# Patient Record
Sex: Male | Born: 1940 | Race: White | Hispanic: No | Marital: Married | Smoking: Never smoker
Health system: Southern US, Community
[De-identification: ages and names within clinical notes are randomized; demographics above are authoritative.]

## PROBLEM LIST (undated history)

## (undated) HISTORY — PX: CHOLECYSTECTOMY: SHX55

---

## 2014-12-19 ENCOUNTER — Emergency Department (HOSPITAL_COMMUNITY)
Admission: EM | Admit: 2014-12-19 | Discharge: 2014-12-19 | Disposition: A | Discharge status: Home / Self Care | Payer: Medicare HMO | Attending: Emergency Medicine | Admitting: Emergency Medicine

## 2014-12-19 ENCOUNTER — Encounter (HOSPITAL_COMMUNITY): Payer: Self-pay | Admitting: Emergency Medicine

## 2014-12-19 ENCOUNTER — Emergency Department (HOSPITAL_COMMUNITY): Payer: Medicare HMO

## 2014-12-19 DIAGNOSIS — Z79899 Other long term (current) drug therapy: Secondary | ICD-10-CM | POA: Diagnosis not present

## 2014-12-19 DIAGNOSIS — R112 Nausea with vomiting, unspecified: Secondary | ICD-10-CM | POA: Insufficient documentation

## 2014-12-19 DIAGNOSIS — R42 Dizziness and giddiness: Secondary | ICD-10-CM | POA: Diagnosis present

## 2014-12-19 LAB — CBC WITH DIFFERENTIAL/PLATELET
BASOS ABS: 0 10*3/uL (ref 0.0–0.1)
Basophils Relative: 0 % (ref 0–1)
EOS ABS: 0 10*3/uL (ref 0.0–0.7)
EOS PCT: 1 % (ref 0–5)
HCT: 39.5 % (ref 39.0–52.0)
HEMOGLOBIN: 13.5 g/dL (ref 13.0–17.0)
LYMPHS PCT: 10 % — AB (ref 12–46)
Lymphs Abs: 0.8 10*3/uL (ref 0.7–4.0)
MCH: 29.6 pg (ref 26.0–34.0)
MCHC: 34.2 g/dL (ref 30.0–36.0)
MCV: 86.6 fL (ref 78.0–100.0)
Monocytes Absolute: 0.3 10*3/uL (ref 0.1–1.0)
Monocytes Relative: 4 % (ref 3–12)
NEUTROS PCT: 85 % — AB (ref 43–77)
Neutro Abs: 6.7 10*3/uL (ref 1.7–7.7)
PLATELETS: 143 10*3/uL — AB (ref 150–400)
RBC: 4.56 MIL/uL (ref 4.22–5.81)
RDW: 13.4 % (ref 11.5–15.5)
WBC: 7.8 10*3/uL (ref 4.0–10.5)

## 2014-12-19 LAB — BASIC METABOLIC PANEL
ANION GAP: 7 (ref 5–15)
BUN: 15 mg/dL (ref 6–20)
CO2: 26 mmol/L (ref 22–32)
Calcium: 9 mg/dL (ref 8.9–10.3)
Chloride: 105 mmol/L (ref 101–111)
Creatinine, Ser: 1.19 mg/dL (ref 0.61–1.24)
GFR, EST NON AFRICAN AMERICAN: 58 mL/min — AB (ref >60)
Glucose, Bld: 171 mg/dL — ABNORMAL HIGH (ref 65–99)
POTASSIUM: 3.8 mmol/L (ref 3.5–5.1)
SODIUM: 138 mmol/L (ref 135–145)

## 2014-12-19 LAB — TROPONIN I

## 2014-12-19 LAB — URINALYSIS, ROUTINE W REFLEX MICROSCOPIC
BILIRUBIN URINE: NEGATIVE
Glucose, UA: NEGATIVE mg/dL
Hgb urine dipstick: NEGATIVE
KETONES UR: NEGATIVE mg/dL
Leukocytes, UA: NEGATIVE
NITRITE: NEGATIVE
PH: 7 (ref 5.0–8.0)
PROTEIN: NEGATIVE mg/dL
Specific Gravity, Urine: 1.01 (ref 1.005–1.030)
UROBILINOGEN UA: 0.2 mg/dL (ref 0.0–1.0)

## 2014-12-19 MED ORDER — ONDANSETRON 4 MG PO TBDP: 4.0000 mg | ORAL_TABLET | Freq: Three times a day (TID) | ORAL | Status: AC | PRN

## 2014-12-19 MED ORDER — MECLIZINE HCL 12.5 MG PO TABS
25.0000 mg | ORAL_TABLET | Freq: Once | ORAL | Status: AC
  Administered 2014-12-19: 25 mg via ORAL
  Filled 2014-12-19: qty 2

## 2014-12-19 MED ORDER — OMEPRAZOLE 20 MG PO CPDR: 20.0000 mg | DELAYED_RELEASE_CAPSULE | Freq: Two times a day (BID) | ORAL | Status: DC

## 2014-12-19 MED ORDER — DICYCLOMINE HCL 20 MG PO TABS: 20.0000 mg | ORAL_TABLET | Freq: Two times a day (BID) | ORAL | Status: DC

## 2014-12-19 MED ORDER — MECLIZINE HCL 25 MG PO TABS: 25.0000 mg | ORAL_TABLET | Freq: Three times a day (TID) | ORAL | Status: AC | PRN

## 2014-12-19 MED ORDER — ONDANSETRON 4 MG PO TBDP: 4.0000 mg | ORAL_TABLET | Freq: Three times a day (TID) | ORAL | Status: DC | PRN

## 2014-12-19 MED ORDER — SODIUM CHLORIDE 0.9 % IV BOLUS (SEPSIS)
1000.0000 mL | Freq: Once | INTRAVENOUS | Status: AC
  Administered 2014-12-19: 1000 mL via INTRAVENOUS

## 2014-12-19 MED ORDER — ONDANSETRON HCL 4 MG/2ML IJ SOLN
4.0000 mg | Freq: Once | INTRAMUSCULAR | Status: AC
  Administered 2014-12-19: 4 mg via INTRAVENOUS
  Filled 2014-12-19: qty 2

## 2014-12-19 NOTE — ED Notes (Signed)
Pt states for the last week intermittent spells of feeling light headed, today associated with vomiting

## 2014-12-19 NOTE — ED Provider Notes (Signed)
CSN: [id number]     Arrival date & time 12/19/14  1159 History  This chart was scribed for Rolland Porter, MD by Octavia Heir, ED Scribe. This patient was seen in room APA01/APA01 and the patient's care was started at 12:38 PM.    Chief Complaint  Patient presents with  . Dizziness      The history is provided by the patient. No language interpreter was used.   HPI Comments: Billy Reyes is a 74 y.o. male who presents to the Emergency Department complaining of sudden onset, intermittent light-headedness onset this morning. He notes this morning he got out of bed and began to feel light-headed. He walked to the table and notes he began to feel as if he was going to pass out and he also vomited. Pt He has associated nausea and felt as if he had to hold on to something. He notes it is more of a "passing out" feeling. Per daughter, pt's PCP has tried putting him on blood pressure medication but pt has not. Pt notes he went to workout a the [employer] and notes he felt a little more light-headed than normal which caused him to hold onto the rails. Pt denies pain, being on new medications, heart problems, kidney problems, abdominal pain, nausea, weakness in arms or legs.   History reviewed. No pertinent past medical history. Past Surgical History  Procedure Laterality Date  . Cholecystectomy     No family history on file. Social History  Substance Use Topics  . Smoking status: Never Smoker   . Smokeless tobacco: None  . Alcohol Use: No    Review of Systems  Constitutional: Negative for fever, chills, diaphoresis, appetite change and fatigue.  HENT: Negative for mouth sores, sore throat and trouble swallowing.   Eyes: Negative for visual disturbance.  Respiratory: Negative for cough, chest tightness, shortness of breath and wheezing.   Cardiovascular: Negative for chest pain.  Gastrointestinal: Negative for nausea, vomiting, abdominal pain, diarrhea and abdominal distention.  Endocrine: Negative  for polydipsia, polyphagia and polyuria.  Genitourinary: Negative for dysuria, frequency and hematuria.  Musculoskeletal: Negative for gait problem.  Skin: Negative for color change, pallor and rash.  Neurological: Positive for dizziness and light-headedness. Negative for syncope and headaches.  Hematological: Does not bruise/bleed easily.  Psychiatric/Behavioral: Negative for behavioral problems and confusion.      Allergies  Review of patient's allergies indicates not on file.  Home Medications   Prior to Admission medications   Medication Sig Start Date End Date Taking? Authorizing Provider  finasteride (PROSCAR) 5 MG tablet Take 1 tablet by mouth daily. 12/01/14  Yes Historical Provider, MD  pantoprazole (PROTONIX) 40 MG tablet Take 1 tablet by mouth daily. 12/15/14  Yes Historical Provider, MD  tamsulosin (FLOMAX) 0.4 MG CAPS capsule Take 1 capsule by mouth daily. 12/01/14  Yes Historical Provider, MD  meclizine (ANTIVERT) 25 MG tablet Take 1 tablet (25 mg total) by mouth 3 (three) times daily as needed. 12/19/14   Rolland Porter, MD  ondansetron (ZOFRAN ODT) 4 MG disintegrating tablet Take 1 tablet (4 mg total) by mouth every 8 (eight) hours as needed for nausea. 12/19/14   Rolland Porter, MD   Triage vitals: BP 195/57 mmHg  Pulse 50  Temp(Src) 97.5 F (36.4 C) (Oral)  Resp 20  Ht  (1.803 m)  Wt 197 lb (89.359 kg)  BMI 27.49 kg/m2  SpO2 99% Physical Exam  Constitutional: He is oriented to person, place, and time. He appears well-developed  and well-nourished. No distress.  HENT:  Head: Normocephalic.  Eyes: Conjunctivae are normal. Pupils are equal, round, and reactive to light. No scleral icterus.  Neck: Normal range of motion. Neck supple. No thyromegaly present.  Cardiovascular: Normal rate and regular rhythm.  Exam reveals no gallop and no friction rub.   No murmur heard. Pulmonary/Chest: Effort normal and breath sounds normal. No respiratory distress. He has no wheezes. He  has no rales.  Abdominal: Soft. Bowel sounds are normal. He exhibits no distension. There is no tenderness. There is no rebound.  Musculoskeletal: Normal range of motion.  Neurological: He is alert and oriented to person, place, and time.  Skin: Skin is warm and dry. No rash noted.  Psychiatric: He has a normal mood and affect. His behavior is normal.  Nursing note and vitals reviewed.   ED Course  Procedures  DIAGNOSTIC STUDIES: Oxygen Saturation is 99% on RA, normal by my interpretation.  COORDINATION OF CARE:  12:48 PM Discussed treatment plan which includes lab work, EKG, CT scan, fluid through IV with pt at bedside and pt agreed to plan.  Labs Review Labs Reviewed  CBC WITH DIFFERENTIAL/PLATELET - Abnormal; Notable for the following:    Platelets 143 (*)    Neutrophils Relative % 85 (*)    Lymphocytes Relative 10 (*)    All other components within normal limits  BASIC METABOLIC PANEL - Abnormal; Notable for the following:    Glucose, Bld 171 (*)    GFR calc non Af Amer 58 (*)    All other components within normal limits  TROPONIN I  URINALYSIS, ROUTINE W REFLEX MICROSCOPIC (NOT AT [facility])    Imaging Review Ct Head Wo Contrast  12/19/2014   CLINICAL DATA:  Vertigo and nausea beginning this morning.  EXAM: CT HEAD WITHOUT CONTRAST  TECHNIQUE: Contiguous axial images were obtained from the base of the skull through the vertex without intravenous contrast.  COMPARISON:  None.  FINDINGS: The inferior aspect of the posterior fossa was incompletely imaged. There is no evidence of acute cortical infarct, intracranial hemorrhage, mass, midline shift, or extra-axial fluid collection. Ventricles and sulci are normal.  Orbits are unremarkable. Visualized paranasal sinuses and mastoid air cells are clear. No skull fracture.  IMPRESSION: Inferior-most cerebellum and brainstem not imaged. Otherwise unremarkable head CT.   Electronically Signed   By: Sebastian Ache M.D.   On: 12/19/2014 16:10    I have personally reviewed and evaluated these images and lab results as part of my medical decision-making.   EKG Interpretation None      MDM   Final diagnoses:  Vertigo   Patient's blood pressure somewhat labile. As low as 110, as high as 190. These do not correlate with symptoms. He is ambulatory. Blood pressure 170. Heart rate varies between 49 and 79. Again not correlating with symptoms. He has reproducible positional vertigo. Plan will be treatment at home. Orthostatic and vertigo precautions. Recheck blood pressure with primary care physician.   Rolland Porter, MD 12/19/14 1626

## 2014-12-19 NOTE — Discharge Instructions (Signed)
No driving until 24 hours without vertigo.  Take the medications until 24 hours without vertigo/dizziness. Recheck your blood pressure with your primary care physician.   Vertigo Vertigo means you feel like you or your surroundings are moving when they are not. Vertigo can be dangerous if it occurs when you are at work, driving, or performing difficult activities.  CAUSES  Vertigo occurs when there is a conflict of signals sent to your brain from the visual and sensory systems in your body. There are many different causes of vertigo, including:  Infections, especially in the inner ear.  A bad reaction to a drug or misuse of alcohol and medicines.  Withdrawal from drugs or alcohol.  Rapidly changing positions, such as lying down or rolling over in bed.  A migraine headache.  Decreased blood flow to the brain.  Increased pressure in the brain from a head injury, infection, tumor, or bleeding. SYMPTOMS  You may feel as though the world is spinning around or you are falling to the ground. Because your balance is upset, vertigo can cause nausea and vomiting. You may have involuntary eye movements (nystagmus). DIAGNOSIS  Vertigo is usually diagnosed by physical exam. If the cause of your vertigo is unknown, your caregiver may perform imaging tests, such as an MRI scan (magnetic resonance imaging). TREATMENT  Most cases of vertigo resolve on their own, without treatment. Depending on the cause, your caregiver may prescribe certain medicines. If your vertigo is related to body position issues, your caregiver may recommend movements or procedures to correct the problem. In rare cases, if your vertigo is caused by certain inner ear problems, you may need surgery. HOME CARE INSTRUCTIONS   Follow your caregiver's instructions.  Avoid driving.  Avoid operating heavy machinery.  Avoid performing any tasks that would be dangerous to you or others during a vertigo episode.  Tell your caregiver  if you notice that certain medicines seem to be causing your vertigo. Some of the medicines used to treat vertigo episodes can actually make them worse in some people. SEEK IMMEDIATE MEDICAL CARE IF:   Your medicines do not relieve your vertigo or are making it worse.  You develop problems with talking, walking, weakness, or using your arms, hands, or legs.  You develop severe headaches.  Your nausea or vomiting continues or gets worse.  You develop visual changes.  A family member notices behavioral changes.  Your condition gets worse. MAKE SURE YOU:  Understand these instructions.  Will watch your condition.  Will get help right away if you are not doing well or get worse. Document Released: 01/04/2005 Document Revised: 06/19/2011 Document Reviewed: 10/13/2010 [facility] Patient Information 2015 [place], [place]. This information is not intended to replace advice given to you by your health care provider. Make sure you discuss any questions you have with your health care provider.

## 2015-02-26 DIAGNOSIS — H02402 Unspecified ptosis of left eyelid: Secondary | ICD-10-CM | POA: Diagnosis not present

## 2015-02-26 DIAGNOSIS — H40013 Open angle with borderline findings, low risk, bilateral: Secondary | ICD-10-CM | POA: Diagnosis not present

## 2015-03-19 DIAGNOSIS — E784 Other hyperlipidemia: Secondary | ICD-10-CM | POA: Diagnosis not present

## 2015-03-19 DIAGNOSIS — I10 Essential (primary) hypertension: Secondary | ICD-10-CM | POA: Diagnosis not present

## 2015-03-19 DIAGNOSIS — Z6828 Body mass index (BMI) 28.0-28.9, adult: Secondary | ICD-10-CM | POA: Diagnosis not present

## 2015-04-23 DIAGNOSIS — H5203 Hypermetropia, bilateral: Secondary | ICD-10-CM | POA: Diagnosis not present

## 2015-04-23 DIAGNOSIS — H40013 Open angle with borderline findings, low risk, bilateral: Secondary | ICD-10-CM | POA: Diagnosis not present

## 2015-04-23 DIAGNOSIS — Z0100 Encounter for examination of eyes and vision without abnormal findings: Secondary | ICD-10-CM | POA: Diagnosis not present

## 2015-04-23 DIAGNOSIS — H2513 Age-related nuclear cataract, bilateral: Secondary | ICD-10-CM | POA: Diagnosis not present

## 2015-04-23 DIAGNOSIS — H02402 Unspecified ptosis of left eyelid: Secondary | ICD-10-CM | POA: Diagnosis not present

## 2015-06-18 DIAGNOSIS — E784 Other hyperlipidemia: Secondary | ICD-10-CM | POA: Diagnosis not present

## 2015-06-18 DIAGNOSIS — Z6828 Body mass index (BMI) 28.0-28.9, adult: Secondary | ICD-10-CM | POA: Diagnosis not present

## 2015-06-18 DIAGNOSIS — I10 Essential (primary) hypertension: Secondary | ICD-10-CM | POA: Diagnosis not present

## 2015-08-02 DIAGNOSIS — L570 Actinic keratosis: Secondary | ICD-10-CM | POA: Diagnosis not present

## 2015-08-02 DIAGNOSIS — Z6827 Body mass index (BMI) 27.0-27.9, adult: Secondary | ICD-10-CM | POA: Diagnosis not present

## 2015-11-12 DIAGNOSIS — L723 Sebaceous cyst: Secondary | ICD-10-CM | POA: Diagnosis not present

## 2015-11-12 DIAGNOSIS — Z6827 Body mass index (BMI) 27.0-27.9, adult: Secondary | ICD-10-CM | POA: Diagnosis not present

## 2015-12-21 DIAGNOSIS — Z23 Encounter for immunization: Secondary | ICD-10-CM | POA: Diagnosis not present

## 2016-02-15 DIAGNOSIS — Z0000 Encounter for general adult medical examination without abnormal findings: Secondary | ICD-10-CM | POA: Diagnosis not present

## 2016-02-15 DIAGNOSIS — K210 Gastro-esophageal reflux disease with esophagitis: Secondary | ICD-10-CM | POA: Diagnosis not present

## 2016-02-15 DIAGNOSIS — Z6827 Body mass index (BMI) 27.0-27.9, adult: Secondary | ICD-10-CM | POA: Diagnosis not present

## 2016-02-15 DIAGNOSIS — Z1389 Encounter for screening for other disorder: Secondary | ICD-10-CM | POA: Diagnosis not present

## 2016-02-15 DIAGNOSIS — N4289 Other specified disorders of prostate: Secondary | ICD-10-CM | POA: Diagnosis not present

## 2016-02-15 DIAGNOSIS — I10 Essential (primary) hypertension: Secondary | ICD-10-CM | POA: Diagnosis not present

## 2016-04-21 DIAGNOSIS — H2513 Age-related nuclear cataract, bilateral: Secondary | ICD-10-CM | POA: Diagnosis not present

## 2016-04-21 DIAGNOSIS — H02402 Unspecified ptosis of left eyelid: Secondary | ICD-10-CM | POA: Diagnosis not present

## 2016-04-21 DIAGNOSIS — H40013 Open angle with borderline findings, low risk, bilateral: Secondary | ICD-10-CM | POA: Diagnosis not present

## 2016-05-23 DIAGNOSIS — I10 Essential (primary) hypertension: Secondary | ICD-10-CM | POA: Diagnosis not present

## 2016-05-23 DIAGNOSIS — K210 Gastro-esophageal reflux disease with esophagitis: Secondary | ICD-10-CM | POA: Diagnosis not present

## 2016-05-23 DIAGNOSIS — N4289 Other specified disorders of prostate: Secondary | ICD-10-CM | POA: Diagnosis not present

## 2016-05-23 DIAGNOSIS — Z6828 Body mass index (BMI) 28.0-28.9, adult: Secondary | ICD-10-CM | POA: Diagnosis not present

## 2016-05-23 DIAGNOSIS — I872 Venous insufficiency (chronic) (peripheral): Secondary | ICD-10-CM | POA: Diagnosis not present

## 2016-06-20 DIAGNOSIS — R3915 Urgency of urination: Secondary | ICD-10-CM | POA: Diagnosis not present

## 2016-06-20 DIAGNOSIS — I10 Essential (primary) hypertension: Secondary | ICD-10-CM | POA: Diagnosis not present

## 2016-06-20 DIAGNOSIS — K219 Gastro-esophageal reflux disease without esophagitis: Secondary | ICD-10-CM | POA: Diagnosis not present

## 2016-06-20 DIAGNOSIS — Z6828 Body mass index (BMI) 28.0-28.9, adult: Secondary | ICD-10-CM | POA: Diagnosis not present

## 2016-06-20 DIAGNOSIS — Z0000 Encounter for general adult medical examination without abnormal findings: Secondary | ICD-10-CM | POA: Diagnosis not present

## 2016-06-20 DIAGNOSIS — Z79899 Other long term (current) drug therapy: Secondary | ICD-10-CM | POA: Diagnosis not present

## 2016-06-20 DIAGNOSIS — H6123 Impacted cerumen, bilateral: Secondary | ICD-10-CM | POA: Diagnosis not present

## 2016-06-20 DIAGNOSIS — Z87891 Personal history of nicotine dependence: Secondary | ICD-10-CM | POA: Diagnosis not present

## 2016-06-20 DIAGNOSIS — Z8669 Personal history of other diseases of the nervous system and sense organs: Secondary | ICD-10-CM | POA: Diagnosis not present

## 2016-08-29 DIAGNOSIS — Z6827 Body mass index (BMI) 27.0-27.9, adult: Secondary | ICD-10-CM | POA: Diagnosis not present

## 2016-08-29 DIAGNOSIS — I10 Essential (primary) hypertension: Secondary | ICD-10-CM | POA: Diagnosis not present

## 2016-08-29 DIAGNOSIS — N4289 Other specified disorders of prostate: Secondary | ICD-10-CM | POA: Diagnosis not present

## 2016-08-29 DIAGNOSIS — K210 Gastro-esophageal reflux disease with esophagitis: Secondary | ICD-10-CM | POA: Diagnosis not present

## 2016-08-29 DIAGNOSIS — N401 Enlarged prostate with lower urinary tract symptoms: Secondary | ICD-10-CM | POA: Diagnosis not present

## 2016-08-29 DIAGNOSIS — I872 Venous insufficiency (chronic) (peripheral): Secondary | ICD-10-CM | POA: Diagnosis not present

## 2016-10-27 DIAGNOSIS — H40013 Open angle with borderline findings, low risk, bilateral: Secondary | ICD-10-CM | POA: Diagnosis not present

## 2016-10-27 DIAGNOSIS — H02402 Unspecified ptosis of left eyelid: Secondary | ICD-10-CM | POA: Diagnosis not present

## 2016-10-27 DIAGNOSIS — H2513 Age-related nuclear cataract, bilateral: Secondary | ICD-10-CM | POA: Diagnosis not present

## 2016-10-27 DIAGNOSIS — L209 Atopic dermatitis, unspecified: Secondary | ICD-10-CM | POA: Diagnosis not present

## 2016-12-05 DIAGNOSIS — N4289 Other specified disorders of prostate: Secondary | ICD-10-CM | POA: Diagnosis not present

## 2016-12-05 DIAGNOSIS — K210 Gastro-esophageal reflux disease with esophagitis: Secondary | ICD-10-CM | POA: Diagnosis not present

## 2016-12-05 DIAGNOSIS — I872 Venous insufficiency (chronic) (peripheral): Secondary | ICD-10-CM | POA: Diagnosis not present

## 2016-12-05 DIAGNOSIS — Z6827 Body mass index (BMI) 27.0-27.9, adult: Secondary | ICD-10-CM | POA: Diagnosis not present

## 2016-12-05 DIAGNOSIS — I10 Essential (primary) hypertension: Secondary | ICD-10-CM | POA: Diagnosis not present

## 2017-01-26 DIAGNOSIS — Z23 Encounter for immunization: Secondary | ICD-10-CM | POA: Diagnosis not present

## 2017-03-09 DIAGNOSIS — N4289 Other specified disorders of prostate: Secondary | ICD-10-CM | POA: Diagnosis not present

## 2017-03-09 DIAGNOSIS — I10 Essential (primary) hypertension: Secondary | ICD-10-CM | POA: Diagnosis not present

## 2017-03-09 DIAGNOSIS — Z6827 Body mass index (BMI) 27.0-27.9, adult: Secondary | ICD-10-CM | POA: Diagnosis not present

## 2017-03-09 DIAGNOSIS — K210 Gastro-esophageal reflux disease with esophagitis: Secondary | ICD-10-CM | POA: Diagnosis not present

## 2017-03-11 IMAGING — CT CT HEAD W/O CM
1 series · 16 of 30 positions shown, 20 images · non-contrast
Comparison: None.

CLINICAL DATA: Vertigo and nausea beginning this morning.

EXAM:
CT HEAD WITHOUT CONTRAST
TECHNIQUE: Contiguous axial images were obtained from the base of the skull
through the vertex without intravenous contrast.

[Series 2: headseq 4.8 h37s · axial · 0.55mm/px · z∈[+140,+310]mm · 16 of 36 slices shown, 20 images]
[im 2/36  brain]
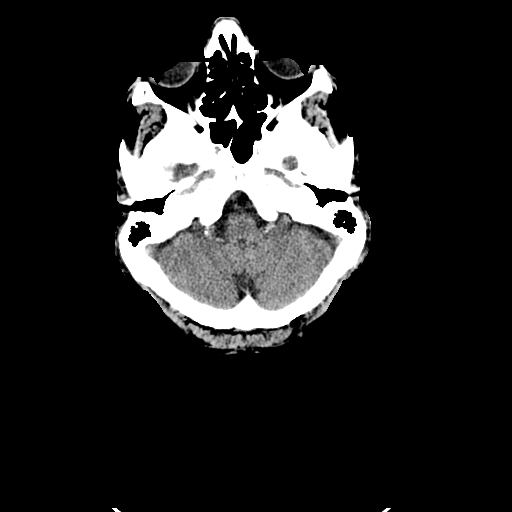
[im 2/36  bone]
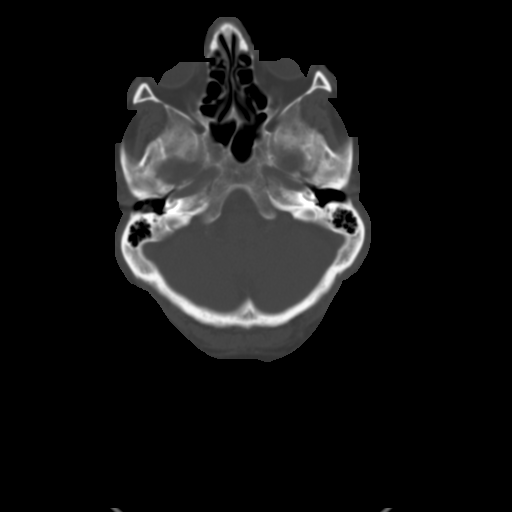
[im 4/36  brain]
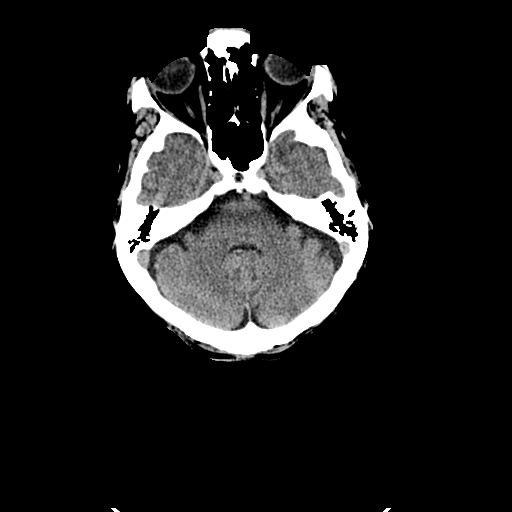
[im 7/36  brain]
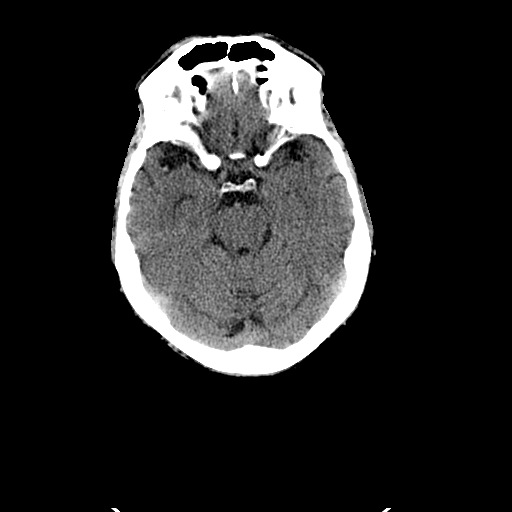
[im 9/36  brain]
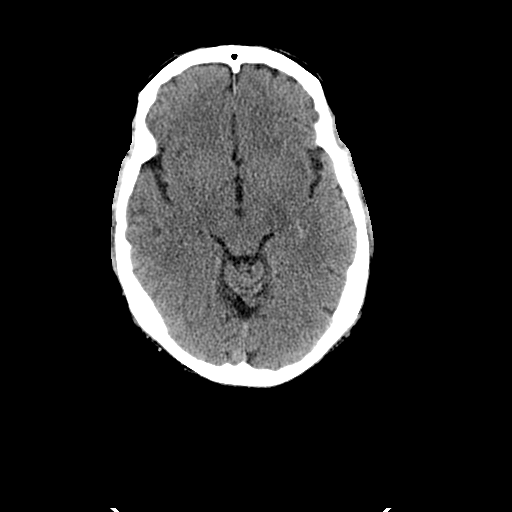
[im 10/36  brain]
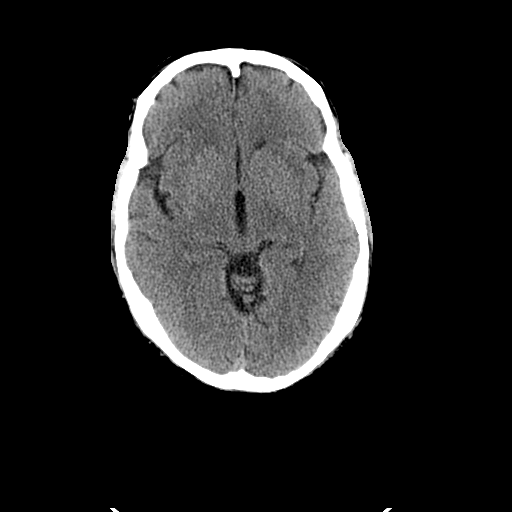
[im 10/36  bone]
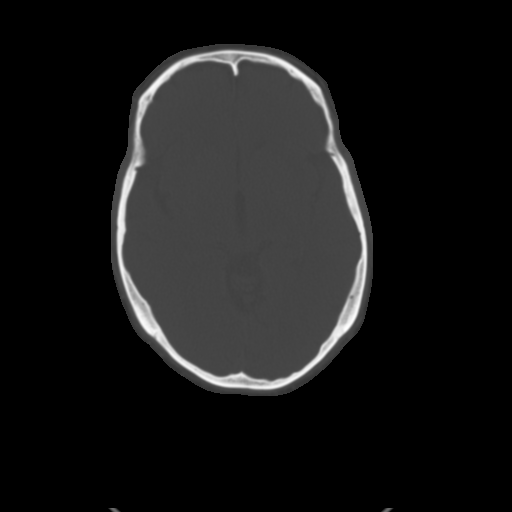
[im 13/36  brain]
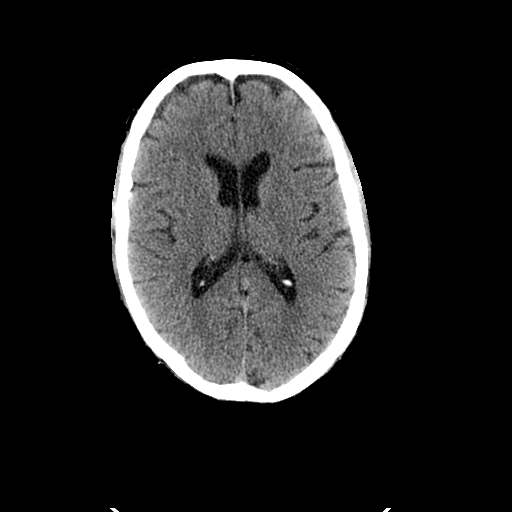
[im 15/36  brain]
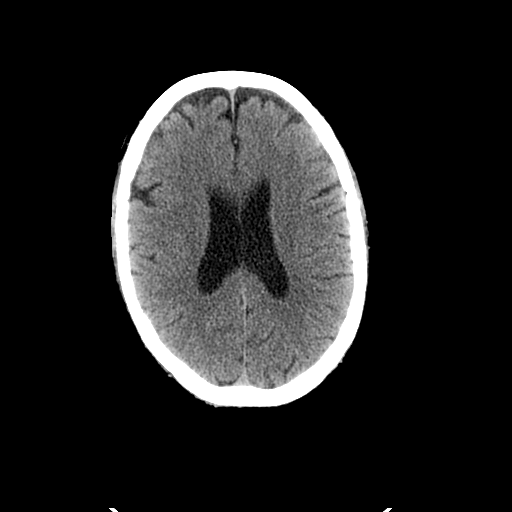
[im 17/36  brain]
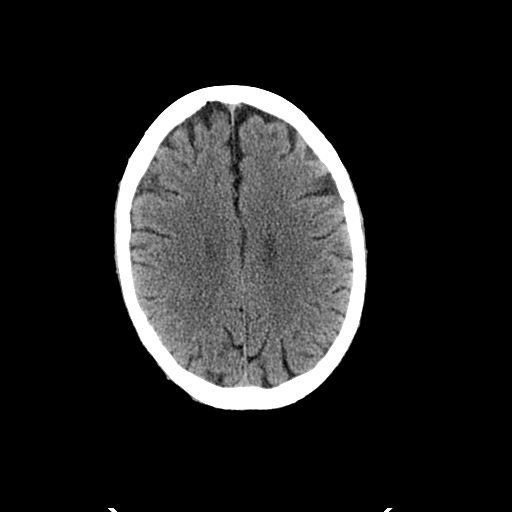
[im 19/36  brain]
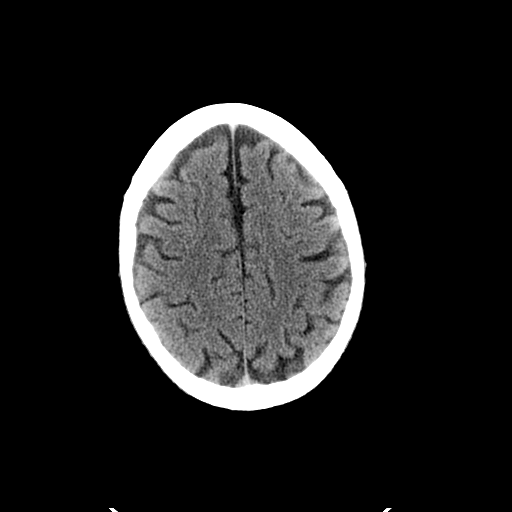
[im 19/36  bone]
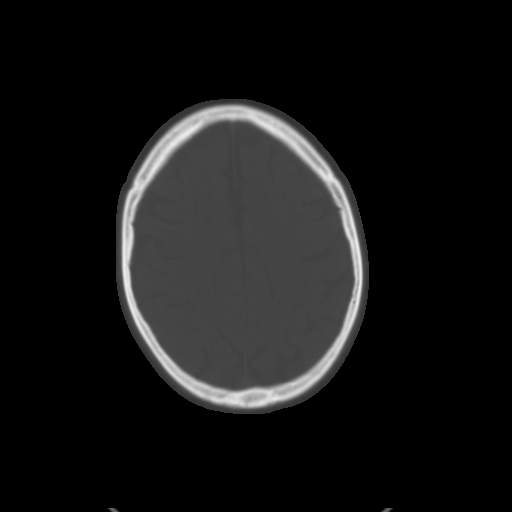
[im 21/36  brain]
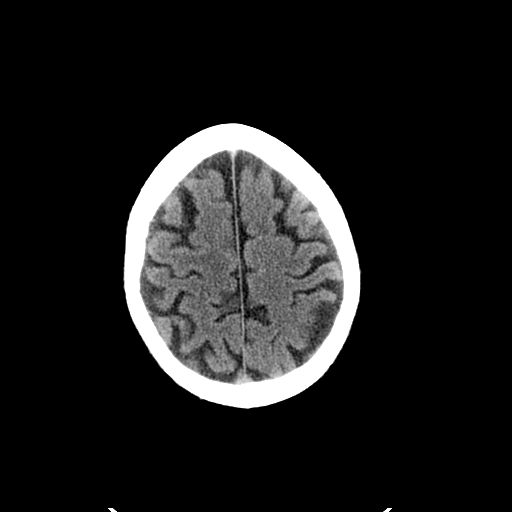
[im 23/36  brain]
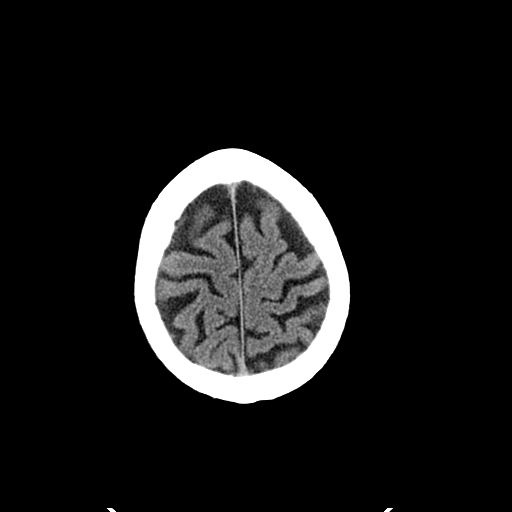
[im 26/36  brain]
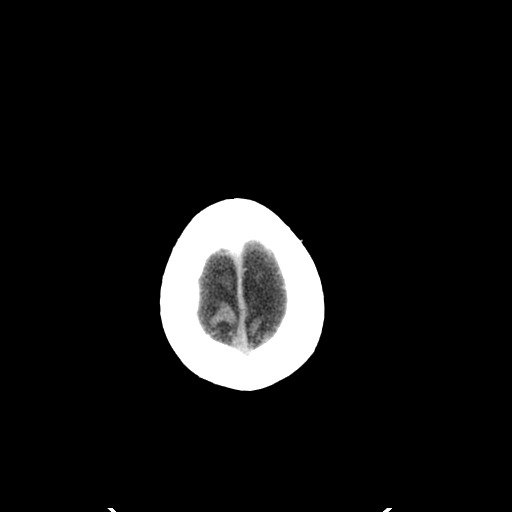
[im 27/36  brain]
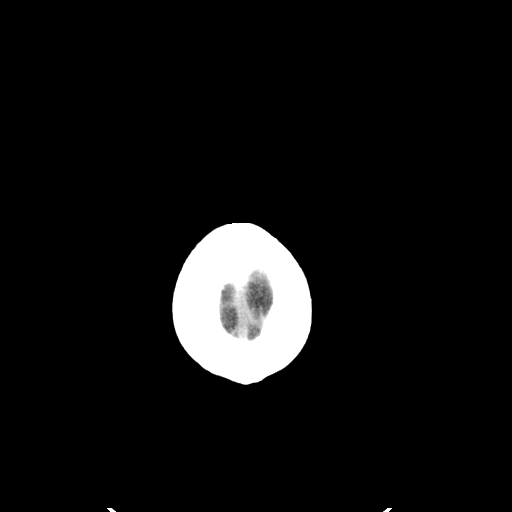
[im 27/36  bone]
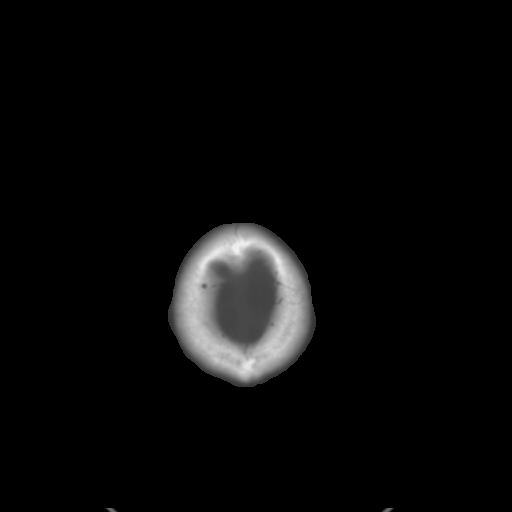
[im 29/36  brain]
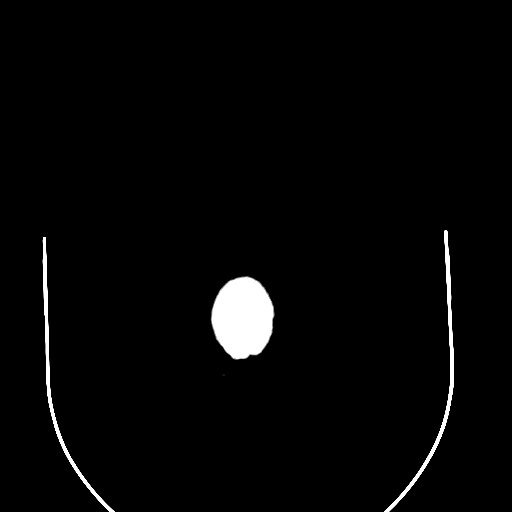
[im 32/36  brain]
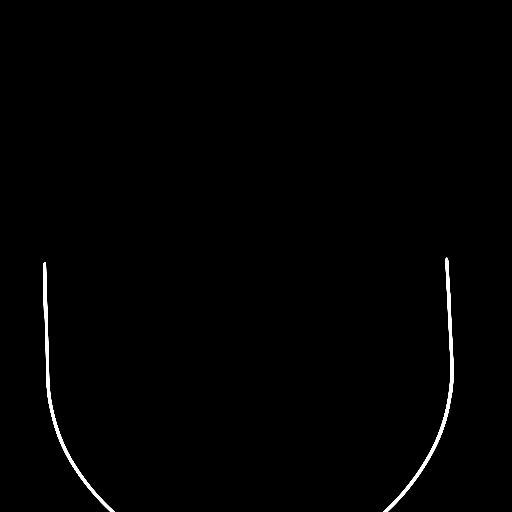
[im 34/36  brain]
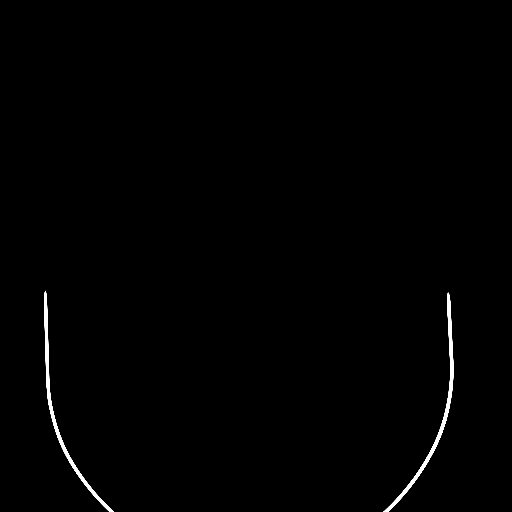

[16 of 30 positions shown; findings below may reference images not displayed]

FINDINGS: The inferior aspect of the posterior fossa was incompletely imaged.
There is no evidence of acute cortical infarct, intracranial
hemorrhage, mass, midline shift, or extra-axial fluid collection.
Ventricles and sulci are normal.

Orbits are unremarkable. Visualized paranasal sinuses and mastoid
air cells are clear. No skull fracture.
IMPRESSION: Inferior-most cerebellum and brainstem not imaged. Otherwise
unremarkable head CT.

## 2017-06-12 DIAGNOSIS — I10 Essential (primary) hypertension: Secondary | ICD-10-CM | POA: Diagnosis not present

## 2017-06-12 DIAGNOSIS — M5431 Sciatica, right side: Secondary | ICD-10-CM | POA: Diagnosis not present

## 2017-06-12 DIAGNOSIS — N4289 Other specified disorders of prostate: Secondary | ICD-10-CM | POA: Diagnosis not present

## 2017-06-12 DIAGNOSIS — K210 Gastro-esophageal reflux disease with esophagitis: Secondary | ICD-10-CM | POA: Diagnosis not present

## 2017-06-12 DIAGNOSIS — Z6828 Body mass index (BMI) 28.0-28.9, adult: Secondary | ICD-10-CM | POA: Diagnosis not present

## 2017-06-12 DIAGNOSIS — Z6827 Body mass index (BMI) 27.0-27.9, adult: Secondary | ICD-10-CM | POA: Diagnosis not present

## 2017-06-26 DIAGNOSIS — H524 Presbyopia: Secondary | ICD-10-CM | POA: Diagnosis not present

## 2017-07-24 DIAGNOSIS — N400 Enlarged prostate without lower urinary tract symptoms: Secondary | ICD-10-CM | POA: Diagnosis not present

## 2017-07-24 DIAGNOSIS — Z8249 Family history of ischemic heart disease and other diseases of the circulatory system: Secondary | ICD-10-CM | POA: Diagnosis not present

## 2017-07-24 DIAGNOSIS — H269 Unspecified cataract: Secondary | ICD-10-CM | POA: Diagnosis not present

## 2017-07-24 DIAGNOSIS — M792 Neuralgia and neuritis, unspecified: Secondary | ICD-10-CM | POA: Diagnosis not present

## 2017-07-24 DIAGNOSIS — R69 Illness, unspecified: Secondary | ICD-10-CM | POA: Diagnosis not present

## 2017-07-24 DIAGNOSIS — Z833 Family history of diabetes mellitus: Secondary | ICD-10-CM | POA: Diagnosis not present

## 2017-07-24 DIAGNOSIS — I10 Essential (primary) hypertension: Secondary | ICD-10-CM | POA: Diagnosis not present

## 2017-07-30 DIAGNOSIS — M5137 Other intervertebral disc degeneration, lumbosacral region: Secondary | ICD-10-CM | POA: Diagnosis not present

## 2017-07-30 DIAGNOSIS — M9905 Segmental and somatic dysfunction of pelvic region: Secondary | ICD-10-CM | POA: Diagnosis not present

## 2017-07-31 DIAGNOSIS — M5137 Other intervertebral disc degeneration, lumbosacral region: Secondary | ICD-10-CM | POA: Diagnosis not present

## 2017-07-31 DIAGNOSIS — M9905 Segmental and somatic dysfunction of pelvic region: Secondary | ICD-10-CM | POA: Diagnosis not present

## 2017-08-01 DIAGNOSIS — M5137 Other intervertebral disc degeneration, lumbosacral region: Secondary | ICD-10-CM | POA: Diagnosis not present

## 2017-08-01 DIAGNOSIS — M9905 Segmental and somatic dysfunction of pelvic region: Secondary | ICD-10-CM | POA: Diagnosis not present

## 2017-08-06 DIAGNOSIS — M5137 Other intervertebral disc degeneration, lumbosacral region: Secondary | ICD-10-CM | POA: Diagnosis not present

## 2017-08-06 DIAGNOSIS — M9905 Segmental and somatic dysfunction of pelvic region: Secondary | ICD-10-CM | POA: Diagnosis not present

## 2017-08-08 DIAGNOSIS — M5137 Other intervertebral disc degeneration, lumbosacral region: Secondary | ICD-10-CM | POA: Diagnosis not present

## 2017-08-08 DIAGNOSIS — M9905 Segmental and somatic dysfunction of pelvic region: Secondary | ICD-10-CM | POA: Diagnosis not present

## 2017-08-09 DIAGNOSIS — M5137 Other intervertebral disc degeneration, lumbosacral region: Secondary | ICD-10-CM | POA: Diagnosis not present

## 2017-08-09 DIAGNOSIS — M9905 Segmental and somatic dysfunction of pelvic region: Secondary | ICD-10-CM | POA: Diagnosis not present

## 2017-08-13 DIAGNOSIS — M9905 Segmental and somatic dysfunction of pelvic region: Secondary | ICD-10-CM | POA: Diagnosis not present

## 2017-08-13 DIAGNOSIS — M5137 Other intervertebral disc degeneration, lumbosacral region: Secondary | ICD-10-CM | POA: Diagnosis not present

## 2017-08-15 DIAGNOSIS — M5137 Other intervertebral disc degeneration, lumbosacral region: Secondary | ICD-10-CM | POA: Diagnosis not present

## 2017-08-15 DIAGNOSIS — M9905 Segmental and somatic dysfunction of pelvic region: Secondary | ICD-10-CM | POA: Diagnosis not present

## 2017-08-16 DIAGNOSIS — M5137 Other intervertebral disc degeneration, lumbosacral region: Secondary | ICD-10-CM | POA: Diagnosis not present

## 2017-08-16 DIAGNOSIS — M9905 Segmental and somatic dysfunction of pelvic region: Secondary | ICD-10-CM | POA: Diagnosis not present

## 2017-09-18 DIAGNOSIS — K210 Gastro-esophageal reflux disease with esophagitis: Secondary | ICD-10-CM | POA: Diagnosis not present

## 2017-09-18 DIAGNOSIS — M5431 Sciatica, right side: Secondary | ICD-10-CM | POA: Diagnosis not present

## 2017-09-18 DIAGNOSIS — Z6827 Body mass index (BMI) 27.0-27.9, adult: Secondary | ICD-10-CM | POA: Diagnosis not present

## 2017-09-18 DIAGNOSIS — I10 Essential (primary) hypertension: Secondary | ICD-10-CM | POA: Diagnosis not present

## 2017-09-18 DIAGNOSIS — N4289 Other specified disorders of prostate: Secondary | ICD-10-CM | POA: Diagnosis not present

## 2017-12-27 DIAGNOSIS — N4289 Other specified disorders of prostate: Secondary | ICD-10-CM | POA: Diagnosis not present

## 2017-12-27 DIAGNOSIS — K210 Gastro-esophageal reflux disease with esophagitis: Secondary | ICD-10-CM | POA: Diagnosis not present

## 2017-12-27 DIAGNOSIS — Z6827 Body mass index (BMI) 27.0-27.9, adult: Secondary | ICD-10-CM | POA: Diagnosis not present

## 2017-12-27 DIAGNOSIS — Z1389 Encounter for screening for other disorder: Secondary | ICD-10-CM | POA: Diagnosis not present

## 2017-12-27 DIAGNOSIS — I10 Essential (primary) hypertension: Secondary | ICD-10-CM | POA: Diagnosis not present

## 2017-12-27 DIAGNOSIS — M5431 Sciatica, right side: Secondary | ICD-10-CM | POA: Diagnosis not present

## 2017-12-27 DIAGNOSIS — Z125 Encounter for screening for malignant neoplasm of prostate: Secondary | ICD-10-CM | POA: Diagnosis not present

## 2017-12-27 DIAGNOSIS — Z0000 Encounter for general adult medical examination without abnormal findings: Secondary | ICD-10-CM | POA: Diagnosis not present

## 2018-01-30 DIAGNOSIS — R69 Illness, unspecified: Secondary | ICD-10-CM | POA: Diagnosis not present

## 2018-02-12 DIAGNOSIS — M810 Age-related osteoporosis without current pathological fracture: Secondary | ICD-10-CM | POA: Diagnosis not present

## 2018-04-09 DIAGNOSIS — I10 Essential (primary) hypertension: Secondary | ICD-10-CM | POA: Diagnosis not present

## 2018-04-09 DIAGNOSIS — Z6828 Body mass index (BMI) 28.0-28.9, adult: Secondary | ICD-10-CM | POA: Diagnosis not present

## 2018-04-09 DIAGNOSIS — N4289 Other specified disorders of prostate: Secondary | ICD-10-CM | POA: Diagnosis not present

## 2018-04-09 DIAGNOSIS — J329 Chronic sinusitis, unspecified: Secondary | ICD-10-CM | POA: Diagnosis not present

## 2018-04-09 DIAGNOSIS — K210 Gastro-esophageal reflux disease with esophagitis: Secondary | ICD-10-CM | POA: Diagnosis not present

## 2018-06-19 DIAGNOSIS — R69 Illness, unspecified: Secondary | ICD-10-CM | POA: Diagnosis not present

## 2018-09-10 DIAGNOSIS — R69 Illness, unspecified: Secondary | ICD-10-CM | POA: Diagnosis not present

## 2018-10-01 DIAGNOSIS — N4289 Other specified disorders of prostate: Secondary | ICD-10-CM | POA: Diagnosis not present

## 2018-10-01 DIAGNOSIS — K210 Gastro-esophageal reflux disease with esophagitis: Secondary | ICD-10-CM | POA: Diagnosis not present

## 2018-10-01 DIAGNOSIS — Z6828 Body mass index (BMI) 28.0-28.9, adult: Secondary | ICD-10-CM | POA: Diagnosis not present

## 2018-10-01 DIAGNOSIS — I10 Essential (primary) hypertension: Secondary | ICD-10-CM | POA: Diagnosis not present

## 2018-10-01 DIAGNOSIS — Z0000 Encounter for general adult medical examination without abnormal findings: Secondary | ICD-10-CM | POA: Diagnosis not present

## 2018-10-09 DIAGNOSIS — H259 Unspecified age-related cataract: Secondary | ICD-10-CM | POA: Diagnosis not present

## 2018-10-09 DIAGNOSIS — H524 Presbyopia: Secondary | ICD-10-CM | POA: Diagnosis not present

## 2018-10-09 DIAGNOSIS — H02402 Unspecified ptosis of left eyelid: Secondary | ICD-10-CM | POA: Diagnosis not present

## 2018-10-09 DIAGNOSIS — I10 Essential (primary) hypertension: Secondary | ICD-10-CM | POA: Diagnosis not present

## 2018-10-09 DIAGNOSIS — H1131 Conjunctival hemorrhage, right eye: Secondary | ICD-10-CM | POA: Diagnosis not present

## 2018-10-09 DIAGNOSIS — H40013 Open angle with borderline findings, low risk, bilateral: Secondary | ICD-10-CM | POA: Diagnosis not present

## 2018-10-09 DIAGNOSIS — Z135 Encounter for screening for eye and ear disorders: Secondary | ICD-10-CM | POA: Diagnosis not present

## 2018-10-22 DIAGNOSIS — R69 Illness, unspecified: Secondary | ICD-10-CM | POA: Diagnosis not present

## 2019-01-01 DIAGNOSIS — N4289 Other specified disorders of prostate: Secondary | ICD-10-CM | POA: Diagnosis not present

## 2019-01-01 DIAGNOSIS — K210 Gastro-esophageal reflux disease with esophagitis: Secondary | ICD-10-CM | POA: Diagnosis not present

## 2019-01-01 DIAGNOSIS — Z6828 Body mass index (BMI) 28.0-28.9, adult: Secondary | ICD-10-CM | POA: Diagnosis not present

## 2019-01-01 DIAGNOSIS — L570 Actinic keratosis: Secondary | ICD-10-CM | POA: Diagnosis not present

## 2019-01-01 DIAGNOSIS — I10 Essential (primary) hypertension: Secondary | ICD-10-CM | POA: Diagnosis not present

## 2019-01-30 DIAGNOSIS — R69 Illness, unspecified: Secondary | ICD-10-CM | POA: Diagnosis not present

## 2019-04-09 DIAGNOSIS — Z6828 Body mass index (BMI) 28.0-28.9, adult: Secondary | ICD-10-CM | POA: Diagnosis not present

## 2019-04-09 DIAGNOSIS — K2100 Gastro-esophageal reflux disease with esophagitis, without bleeding: Secondary | ICD-10-CM | POA: Diagnosis not present

## 2019-04-09 DIAGNOSIS — Z1389 Encounter for screening for other disorder: Secondary | ICD-10-CM | POA: Diagnosis not present

## 2019-04-09 DIAGNOSIS — I10 Essential (primary) hypertension: Secondary | ICD-10-CM | POA: Diagnosis not present

## 2019-04-09 DIAGNOSIS — N4289 Other specified disorders of prostate: Secondary | ICD-10-CM | POA: Diagnosis not present

## 2019-04-09 DIAGNOSIS — Z0000 Encounter for general adult medical examination without abnormal findings: Secondary | ICD-10-CM | POA: Diagnosis not present

## 2019-07-02 DIAGNOSIS — Z125 Encounter for screening for malignant neoplasm of prostate: Secondary | ICD-10-CM | POA: Diagnosis not present

## 2019-07-02 DIAGNOSIS — Z6828 Body mass index (BMI) 28.0-28.9, adult: Secondary | ICD-10-CM | POA: Diagnosis not present

## 2019-07-02 DIAGNOSIS — N4289 Other specified disorders of prostate: Secondary | ICD-10-CM | POA: Diagnosis not present

## 2019-07-02 DIAGNOSIS — I10 Essential (primary) hypertension: Secondary | ICD-10-CM | POA: Diagnosis not present

## 2019-07-02 DIAGNOSIS — K2100 Gastro-esophageal reflux disease with esophagitis, without bleeding: Secondary | ICD-10-CM | POA: Diagnosis not present

## 2019-07-08 DIAGNOSIS — R69 Illness, unspecified: Secondary | ICD-10-CM | POA: Diagnosis not present

## 2019-08-12 DIAGNOSIS — R69 Illness, unspecified: Secondary | ICD-10-CM | POA: Diagnosis not present

## 2019-09-30 DIAGNOSIS — I10 Essential (primary) hypertension: Secondary | ICD-10-CM | POA: Diagnosis not present

## 2019-09-30 DIAGNOSIS — Z6828 Body mass index (BMI) 28.0-28.9, adult: Secondary | ICD-10-CM | POA: Diagnosis not present

## 2019-09-30 DIAGNOSIS — N4289 Other specified disorders of prostate: Secondary | ICD-10-CM | POA: Diagnosis not present

## 2019-09-30 DIAGNOSIS — K2100 Gastro-esophageal reflux disease with esophagitis, without bleeding: Secondary | ICD-10-CM | POA: Diagnosis not present

## 2019-10-01 DIAGNOSIS — H02402 Unspecified ptosis of left eyelid: Secondary | ICD-10-CM | POA: Diagnosis not present

## 2019-10-01 DIAGNOSIS — H5200 Hypermetropia, unspecified eye: Secondary | ICD-10-CM | POA: Diagnosis not present

## 2019-10-01 DIAGNOSIS — H40023 Open angle with borderline findings, high risk, bilateral: Secondary | ICD-10-CM | POA: Diagnosis not present

## 2019-10-01 DIAGNOSIS — I10 Essential (primary) hypertension: Secondary | ICD-10-CM | POA: Diagnosis not present

## 2019-10-01 DIAGNOSIS — H2513 Age-related nuclear cataract, bilateral: Secondary | ICD-10-CM | POA: Diagnosis not present

## 2019-10-01 DIAGNOSIS — Z0100 Encounter for examination of eyes and vision without abnormal findings: Secondary | ICD-10-CM | POA: Diagnosis not present

## 2019-10-28 DIAGNOSIS — H2513 Age-related nuclear cataract, bilateral: Secondary | ICD-10-CM | POA: Diagnosis not present

## 2019-10-28 DIAGNOSIS — H40013 Open angle with borderline findings, low risk, bilateral: Secondary | ICD-10-CM | POA: Diagnosis not present

## 2019-12-18 DIAGNOSIS — N4289 Other specified disorders of prostate: Secondary | ICD-10-CM | POA: Diagnosis not present

## 2019-12-18 DIAGNOSIS — K2100 Gastro-esophageal reflux disease with esophagitis, without bleeding: Secondary | ICD-10-CM | POA: Diagnosis not present

## 2019-12-18 DIAGNOSIS — Z6829 Body mass index (BMI) 29.0-29.9, adult: Secondary | ICD-10-CM | POA: Diagnosis not present

## 2019-12-18 DIAGNOSIS — I10 Essential (primary) hypertension: Secondary | ICD-10-CM | POA: Diagnosis not present

## 2019-12-18 DIAGNOSIS — L570 Actinic keratosis: Secondary | ICD-10-CM | POA: Diagnosis not present

## 2020-01-23 DIAGNOSIS — R69 Illness, unspecified: Secondary | ICD-10-CM | POA: Diagnosis not present

## 2020-03-17 DIAGNOSIS — I10 Essential (primary) hypertension: Secondary | ICD-10-CM | POA: Diagnosis not present

## 2020-03-17 DIAGNOSIS — K2100 Gastro-esophageal reflux disease with esophagitis, without bleeding: Secondary | ICD-10-CM | POA: Diagnosis not present

## 2020-03-17 DIAGNOSIS — L570 Actinic keratosis: Secondary | ICD-10-CM | POA: Diagnosis not present

## 2020-03-17 DIAGNOSIS — N4289 Other specified disorders of prostate: Secondary | ICD-10-CM | POA: Diagnosis not present

## 2020-03-17 DIAGNOSIS — Z6829 Body mass index (BMI) 29.0-29.9, adult: Secondary | ICD-10-CM | POA: Diagnosis not present

## 2020-05-03 DIAGNOSIS — H40013 Open angle with borderline findings, low risk, bilateral: Secondary | ICD-10-CM | POA: Diagnosis not present

## 2020-05-03 DIAGNOSIS — H2513 Age-related nuclear cataract, bilateral: Secondary | ICD-10-CM | POA: Diagnosis not present

## 2020-06-15 DIAGNOSIS — I10 Essential (primary) hypertension: Secondary | ICD-10-CM | POA: Diagnosis not present

## 2020-06-15 DIAGNOSIS — N4289 Other specified disorders of prostate: Secondary | ICD-10-CM | POA: Diagnosis not present

## 2020-06-15 DIAGNOSIS — L570 Actinic keratosis: Secondary | ICD-10-CM | POA: Diagnosis not present

## 2020-06-15 DIAGNOSIS — Z6830 Body mass index (BMI) 30.0-30.9, adult: Secondary | ICD-10-CM | POA: Diagnosis not present

## 2020-06-15 DIAGNOSIS — K2100 Gastro-esophageal reflux disease with esophagitis, without bleeding: Secondary | ICD-10-CM | POA: Diagnosis not present

## 2020-06-15 DIAGNOSIS — Z0000 Encounter for general adult medical examination without abnormal findings: Secondary | ICD-10-CM | POA: Diagnosis not present

## 2020-09-14 DIAGNOSIS — N4289 Other specified disorders of prostate: Secondary | ICD-10-CM | POA: Diagnosis not present

## 2020-09-14 DIAGNOSIS — L570 Actinic keratosis: Secondary | ICD-10-CM | POA: Diagnosis not present

## 2020-09-14 DIAGNOSIS — K2100 Gastro-esophageal reflux disease with esophagitis, without bleeding: Secondary | ICD-10-CM | POA: Diagnosis not present

## 2020-09-14 DIAGNOSIS — I10 Essential (primary) hypertension: Secondary | ICD-10-CM | POA: Diagnosis not present

## 2020-09-14 DIAGNOSIS — Z6829 Body mass index (BMI) 29.0-29.9, adult: Secondary | ICD-10-CM | POA: Diagnosis not present

## 2020-12-20 DIAGNOSIS — Z6829 Body mass index (BMI) 29.0-29.9, adult: Secondary | ICD-10-CM | POA: Diagnosis not present

## 2020-12-20 DIAGNOSIS — K2100 Gastro-esophageal reflux disease with esophagitis, without bleeding: Secondary | ICD-10-CM | POA: Diagnosis not present

## 2020-12-20 DIAGNOSIS — L570 Actinic keratosis: Secondary | ICD-10-CM | POA: Diagnosis not present

## 2020-12-20 DIAGNOSIS — Z0000 Encounter for general adult medical examination without abnormal findings: Secondary | ICD-10-CM | POA: Diagnosis not present

## 2020-12-20 DIAGNOSIS — N4289 Other specified disorders of prostate: Secondary | ICD-10-CM | POA: Diagnosis not present

## 2020-12-20 DIAGNOSIS — I10 Essential (primary) hypertension: Secondary | ICD-10-CM | POA: Diagnosis not present

## 2021-03-22 DIAGNOSIS — I10 Essential (primary) hypertension: Secondary | ICD-10-CM | POA: Diagnosis not present

## 2021-03-22 DIAGNOSIS — N4289 Other specified disorders of prostate: Secondary | ICD-10-CM | POA: Diagnosis not present

## 2021-03-22 DIAGNOSIS — L570 Actinic keratosis: Secondary | ICD-10-CM | POA: Diagnosis not present

## 2021-03-22 DIAGNOSIS — K2100 Gastro-esophageal reflux disease with esophagitis, without bleeding: Secondary | ICD-10-CM | POA: Diagnosis not present

## 2021-03-22 DIAGNOSIS — Z6829 Body mass index (BMI) 29.0-29.9, adult: Secondary | ICD-10-CM | POA: Diagnosis not present

## 2021-05-03 DIAGNOSIS — H40013 Open angle with borderline findings, low risk, bilateral: Secondary | ICD-10-CM | POA: Diagnosis not present

## 2021-05-03 DIAGNOSIS — H2513 Age-related nuclear cataract, bilateral: Secondary | ICD-10-CM | POA: Diagnosis not present

## 2021-06-30 DIAGNOSIS — L570 Actinic keratosis: Secondary | ICD-10-CM | POA: Diagnosis not present

## 2021-06-30 DIAGNOSIS — K2100 Gastro-esophageal reflux disease with esophagitis, without bleeding: Secondary | ICD-10-CM | POA: Diagnosis not present

## 2021-06-30 DIAGNOSIS — I10 Essential (primary) hypertension: Secondary | ICD-10-CM | POA: Diagnosis not present

## 2021-06-30 DIAGNOSIS — Z6829 Body mass index (BMI) 29.0-29.9, adult: Secondary | ICD-10-CM | POA: Diagnosis not present

## 2021-06-30 DIAGNOSIS — N4289 Other specified disorders of prostate: Secondary | ICD-10-CM | POA: Diagnosis not present

## 2021-10-06 DIAGNOSIS — N4289 Other specified disorders of prostate: Secondary | ICD-10-CM | POA: Diagnosis not present

## 2021-10-06 DIAGNOSIS — Z6828 Body mass index (BMI) 28.0-28.9, adult: Secondary | ICD-10-CM | POA: Diagnosis not present

## 2021-10-06 DIAGNOSIS — I10 Essential (primary) hypertension: Secondary | ICD-10-CM | POA: Diagnosis not present

## 2021-10-06 DIAGNOSIS — K2100 Gastro-esophageal reflux disease with esophagitis, without bleeding: Secondary | ICD-10-CM | POA: Diagnosis not present

## 2021-10-06 DIAGNOSIS — Z0000 Encounter for general adult medical examination without abnormal findings: Secondary | ICD-10-CM | POA: Diagnosis not present

## 2022-01-18 DIAGNOSIS — K2100 Gastro-esophageal reflux disease with esophagitis, without bleeding: Secondary | ICD-10-CM | POA: Diagnosis not present

## 2022-01-18 DIAGNOSIS — Z6827 Body mass index (BMI) 27.0-27.9, adult: Secondary | ICD-10-CM | POA: Diagnosis not present

## 2022-01-18 DIAGNOSIS — N4289 Other specified disorders of prostate: Secondary | ICD-10-CM | POA: Diagnosis not present

## 2022-01-18 DIAGNOSIS — I10 Essential (primary) hypertension: Secondary | ICD-10-CM | POA: Diagnosis not present

## 2022-04-25 DIAGNOSIS — Z13820 Encounter for screening for osteoporosis: Secondary | ICD-10-CM | POA: Diagnosis not present

## 2022-04-25 DIAGNOSIS — M810 Age-related osteoporosis without current pathological fracture: Secondary | ICD-10-CM | POA: Diagnosis not present

## 2022-05-09 DIAGNOSIS — H2513 Age-related nuclear cataract, bilateral: Secondary | ICD-10-CM | POA: Diagnosis not present

## 2022-05-09 DIAGNOSIS — Z0100 Encounter for examination of eyes and vision without abnormal findings: Secondary | ICD-10-CM | POA: Diagnosis not present

## 2022-05-09 DIAGNOSIS — H40013 Open angle with borderline findings, low risk, bilateral: Secondary | ICD-10-CM | POA: Diagnosis not present

## 2022-05-22 DIAGNOSIS — Z6828 Body mass index (BMI) 28.0-28.9, adult: Secondary | ICD-10-CM | POA: Diagnosis not present

## 2022-05-22 DIAGNOSIS — Z0000 Encounter for general adult medical examination without abnormal findings: Secondary | ICD-10-CM | POA: Diagnosis not present

## 2022-05-22 DIAGNOSIS — N4289 Other specified disorders of prostate: Secondary | ICD-10-CM | POA: Diagnosis not present

## 2022-05-22 DIAGNOSIS — I10 Essential (primary) hypertension: Secondary | ICD-10-CM | POA: Diagnosis not present

## 2022-05-22 DIAGNOSIS — K2100 Gastro-esophageal reflux disease with esophagitis, without bleeding: Secondary | ICD-10-CM | POA: Diagnosis not present

## 2022-07-24 DIAGNOSIS — Z6827 Body mass index (BMI) 27.0-27.9, adult: Secondary | ICD-10-CM | POA: Diagnosis not present

## 2022-07-24 DIAGNOSIS — K2100 Gastro-esophageal reflux disease with esophagitis, without bleeding: Secondary | ICD-10-CM | POA: Diagnosis not present

## 2022-07-24 DIAGNOSIS — I161 Hypertensive emergency: Secondary | ICD-10-CM | POA: Diagnosis not present

## 2022-08-21 DIAGNOSIS — I10 Essential (primary) hypertension: Secondary | ICD-10-CM | POA: Diagnosis not present

## 2022-08-21 DIAGNOSIS — Z0000 Encounter for general adult medical examination without abnormal findings: Secondary | ICD-10-CM | POA: Diagnosis not present

## 2022-08-21 DIAGNOSIS — Z6828 Body mass index (BMI) 28.0-28.9, adult: Secondary | ICD-10-CM | POA: Diagnosis not present

## 2022-08-21 DIAGNOSIS — N5201 Erectile dysfunction due to arterial insufficiency: Secondary | ICD-10-CM | POA: Diagnosis not present

## 2022-08-21 DIAGNOSIS — I161 Hypertensive emergency: Secondary | ICD-10-CM | POA: Diagnosis not present

## 2022-08-21 DIAGNOSIS — K2100 Gastro-esophageal reflux disease with esophagitis, without bleeding: Secondary | ICD-10-CM | POA: Diagnosis not present

## 2022-11-21 DIAGNOSIS — N5201 Erectile dysfunction due to arterial insufficiency: Secondary | ICD-10-CM | POA: Diagnosis not present

## 2022-11-21 DIAGNOSIS — N1831 Chronic kidney disease, stage 3a: Secondary | ICD-10-CM | POA: Diagnosis not present

## 2022-11-21 DIAGNOSIS — I10 Essential (primary) hypertension: Secondary | ICD-10-CM | POA: Diagnosis not present

## 2022-11-21 DIAGNOSIS — L570 Actinic keratosis: Secondary | ICD-10-CM | POA: Diagnosis not present

## 2022-11-21 DIAGNOSIS — Z6828 Body mass index (BMI) 28.0-28.9, adult: Secondary | ICD-10-CM | POA: Diagnosis not present

## 2022-11-21 DIAGNOSIS — K2100 Gastro-esophageal reflux disease with esophagitis, without bleeding: Secondary | ICD-10-CM | POA: Diagnosis not present

## 2023-02-20 DIAGNOSIS — L570 Actinic keratosis: Secondary | ICD-10-CM | POA: Diagnosis not present

## 2023-02-20 DIAGNOSIS — N1831 Chronic kidney disease, stage 3a: Secondary | ICD-10-CM | POA: Diagnosis not present

## 2023-02-20 DIAGNOSIS — Z6828 Body mass index (BMI) 28.0-28.9, adult: Secondary | ICD-10-CM | POA: Diagnosis not present

## 2023-02-20 DIAGNOSIS — K2100 Gastro-esophageal reflux disease with esophagitis, without bleeding: Secondary | ICD-10-CM | POA: Diagnosis not present

## 2023-02-20 DIAGNOSIS — I10 Essential (primary) hypertension: Secondary | ICD-10-CM | POA: Diagnosis not present

## 2023-02-20 DIAGNOSIS — N5201 Erectile dysfunction due to arterial insufficiency: Secondary | ICD-10-CM | POA: Diagnosis not present

## 2023-05-14 DIAGNOSIS — H40013 Open angle with borderline findings, low risk, bilateral: Secondary | ICD-10-CM | POA: Diagnosis not present

## 2023-05-14 DIAGNOSIS — H2513 Age-related nuclear cataract, bilateral: Secondary | ICD-10-CM | POA: Diagnosis not present

## 2023-05-22 DIAGNOSIS — Z6828 Body mass index (BMI) 28.0-28.9, adult: Secondary | ICD-10-CM | POA: Diagnosis not present

## 2023-05-22 DIAGNOSIS — N1831 Chronic kidney disease, stage 3a: Secondary | ICD-10-CM | POA: Diagnosis not present

## 2023-05-22 DIAGNOSIS — Z0000 Encounter for general adult medical examination without abnormal findings: Secondary | ICD-10-CM | POA: Diagnosis not present

## 2023-05-22 DIAGNOSIS — N400 Enlarged prostate without lower urinary tract symptoms: Secondary | ICD-10-CM | POA: Diagnosis not present

## 2023-05-22 DIAGNOSIS — I10 Essential (primary) hypertension: Secondary | ICD-10-CM | POA: Diagnosis not present

## 2023-05-22 DIAGNOSIS — N5201 Erectile dysfunction due to arterial insufficiency: Secondary | ICD-10-CM | POA: Diagnosis not present

## 2023-05-22 DIAGNOSIS — K2100 Gastro-esophageal reflux disease with esophagitis, without bleeding: Secondary | ICD-10-CM | POA: Diagnosis not present

## 2023-08-23 DIAGNOSIS — N1831 Chronic kidney disease, stage 3a: Secondary | ICD-10-CM | POA: Diagnosis not present

## 2023-08-23 DIAGNOSIS — N5201 Erectile dysfunction due to arterial insufficiency: Secondary | ICD-10-CM | POA: Diagnosis not present

## 2023-08-23 DIAGNOSIS — I10 Essential (primary) hypertension: Secondary | ICD-10-CM | POA: Diagnosis not present

## 2023-08-23 DIAGNOSIS — Z6828 Body mass index (BMI) 28.0-28.9, adult: Secondary | ICD-10-CM | POA: Diagnosis not present

## 2023-08-23 DIAGNOSIS — Z0000 Encounter for general adult medical examination without abnormal findings: Secondary | ICD-10-CM | POA: Diagnosis not present

## 2023-08-23 DIAGNOSIS — N400 Enlarged prostate without lower urinary tract symptoms: Secondary | ICD-10-CM | POA: Diagnosis not present

## 2023-08-23 DIAGNOSIS — K2100 Gastro-esophageal reflux disease with esophagitis, without bleeding: Secondary | ICD-10-CM | POA: Diagnosis not present

## 2023-08-23 DIAGNOSIS — L570 Actinic keratosis: Secondary | ICD-10-CM | POA: Diagnosis not present

## 2024-01-02 DIAGNOSIS — K2100 Gastro-esophageal reflux disease with esophagitis, without bleeding: Secondary | ICD-10-CM | POA: Diagnosis not present

## 2024-01-02 DIAGNOSIS — N1832 Chronic kidney disease, stage 3b: Secondary | ICD-10-CM | POA: Diagnosis not present

## 2024-01-02 DIAGNOSIS — N400 Enlarged prostate without lower urinary tract symptoms: Secondary | ICD-10-CM | POA: Diagnosis not present

## 2024-01-02 DIAGNOSIS — Z6827 Body mass index (BMI) 27.0-27.9, adult: Secondary | ICD-10-CM | POA: Diagnosis not present

## 2024-01-02 DIAGNOSIS — N5201 Erectile dysfunction due to arterial insufficiency: Secondary | ICD-10-CM | POA: Diagnosis not present

## 2024-01-02 DIAGNOSIS — I10 Essential (primary) hypertension: Secondary | ICD-10-CM | POA: Diagnosis not present

## 2024-01-08 DIAGNOSIS — I10 Essential (primary) hypertension: Secondary | ICD-10-CM | POA: Diagnosis not present

## 2024-01-08 DIAGNOSIS — N1832 Chronic kidney disease, stage 3b: Secondary | ICD-10-CM | POA: Diagnosis not present

## 2024-02-06 DIAGNOSIS — I10 Essential (primary) hypertension: Secondary | ICD-10-CM | POA: Diagnosis not present

## 2024-02-06 DIAGNOSIS — N1832 Chronic kidney disease, stage 3b: Secondary | ICD-10-CM | POA: Diagnosis not present

## 2024-02-13 DIAGNOSIS — Z6827 Body mass index (BMI) 27.0-27.9, adult: Secondary | ICD-10-CM | POA: Diagnosis not present

## 2024-02-13 DIAGNOSIS — L570 Actinic keratosis: Secondary | ICD-10-CM | POA: Diagnosis not present

## 2024-03-05 DIAGNOSIS — N1832 Chronic kidney disease, stage 3b: Secondary | ICD-10-CM | POA: Diagnosis not present

## 2024-03-05 DIAGNOSIS — I10 Essential (primary) hypertension: Secondary | ICD-10-CM | POA: Diagnosis not present
